# Patient Record
Sex: Female | Born: 1970 | Race: White | Hispanic: No | Marital: Married | Smoking: Current every day smoker
Health system: Southern US, Community
[De-identification: ages and names within clinical notes are randomized; demographics above are authoritative.]

## PROBLEM LIST (undated history)

## (undated) DIAGNOSIS — C569 Malignant neoplasm of unspecified ovary: Secondary | ICD-10-CM

## (undated) DIAGNOSIS — I10 Essential (primary) hypertension: Secondary | ICD-10-CM

## (undated) HISTORY — PX: ABDOMINAL HYSTERECTOMY: SHX81

---

## 2001-06-12 ENCOUNTER — Emergency Department (HOSPITAL_COMMUNITY): Admission: EM | Admit: 2001-06-12 | Discharge: 2001-06-13 | Payer: Self-pay | Admitting: Emergency Medicine

## 2005-01-24 ENCOUNTER — Ambulatory Visit: Payer: Self-pay | Admitting: Obstetrics and Gynecology

## 2010-02-27 ENCOUNTER — Emergency Department: Payer: Self-pay | Admitting: Emergency Medicine

## 2011-03-31 IMAGING — CT CT ABD-PELV W/ CM
1 of 2 series · 15 of 32 positions shown, 19 images · non-contrast
Comparison: none

REASON FOR EXAM: (1) generalized abdominal pain, fever, nausea diarrhea,
h/o total hysterectomy
COMMENTS:

[Series 2: appendicitis · axial · 0.72mm/px · z∈[-332,+100]mm · 15 of 158 slices shown, 19 images]
[im 7/158  soft-tissue]
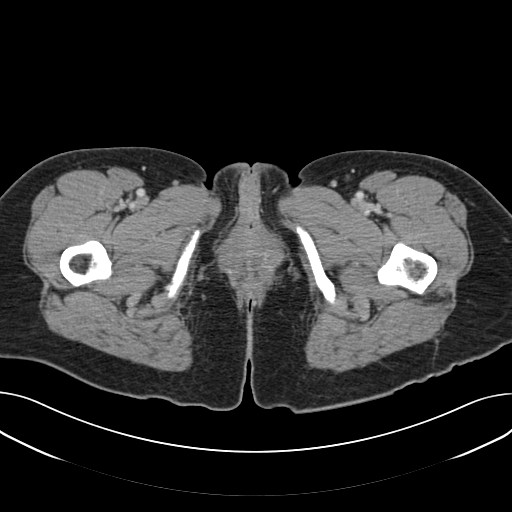
[im 7/158  bone]
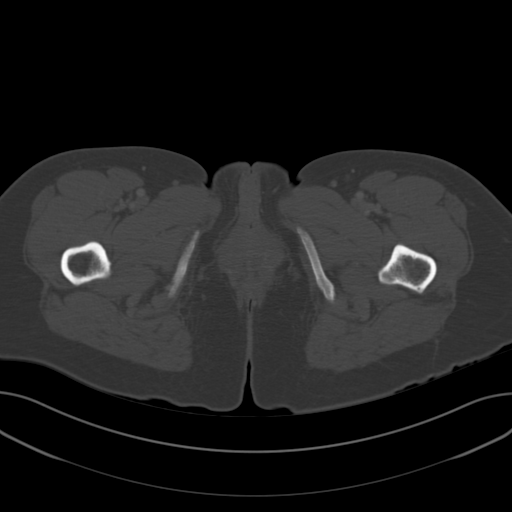
[im 20/158  soft-tissue]
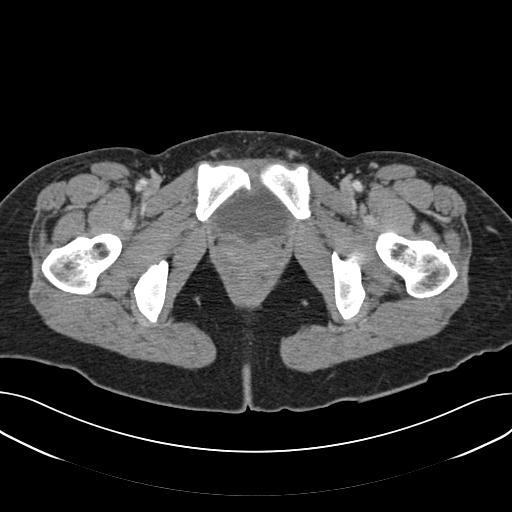
[im 33/158  soft-tissue]
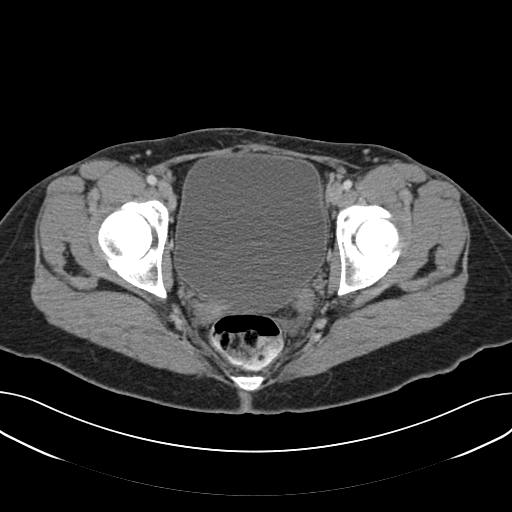
[im 46/158  soft-tissue]
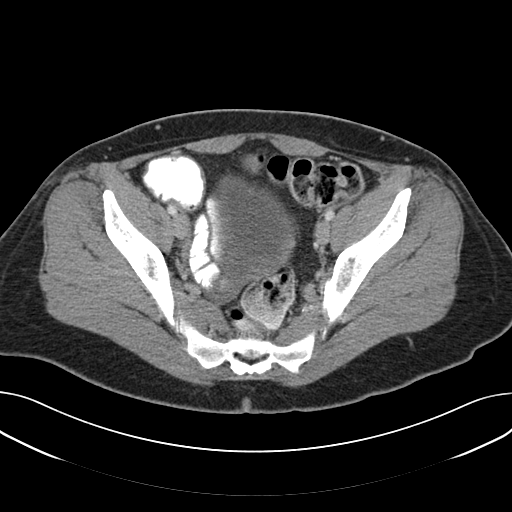
[im 53/158  soft-tissue]
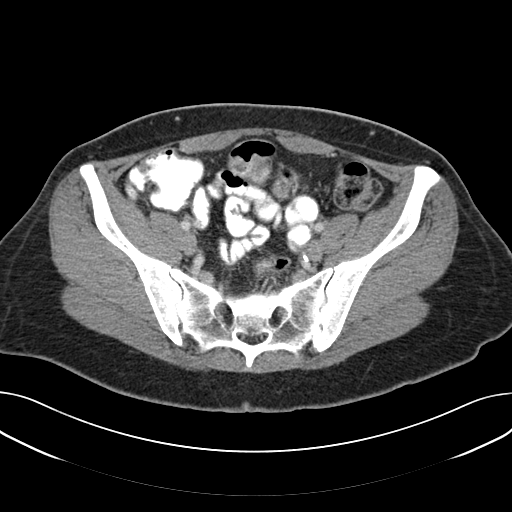
[im 66/158  soft-tissue]
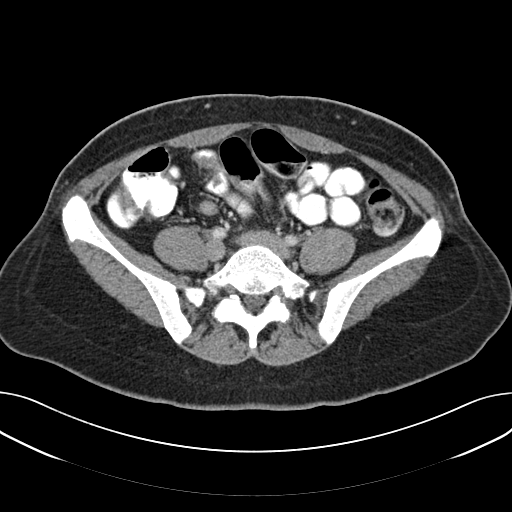
[im 79/158  soft-tissue]
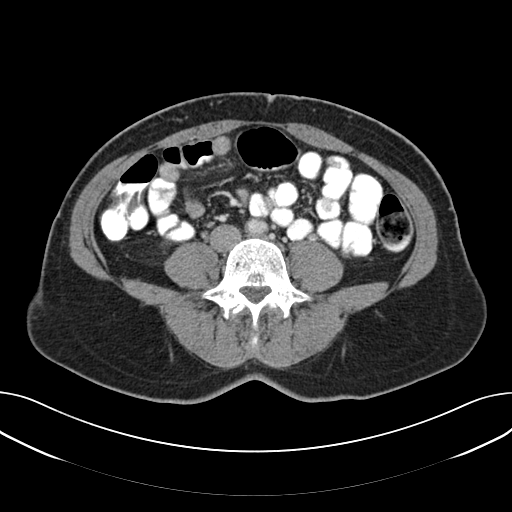
[im 92/158  soft-tissue]
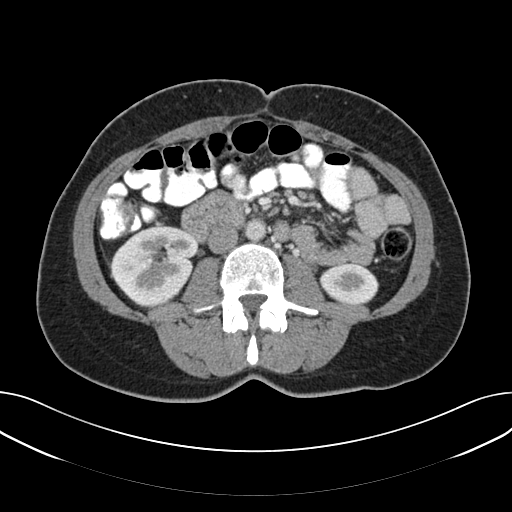
[im 105/158  soft-tissue]
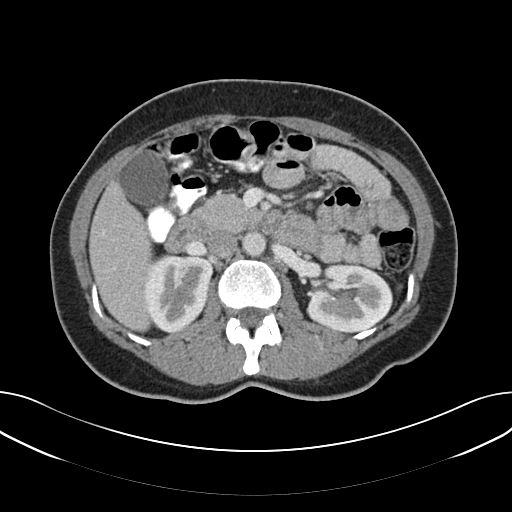
[im 105/158  bone]
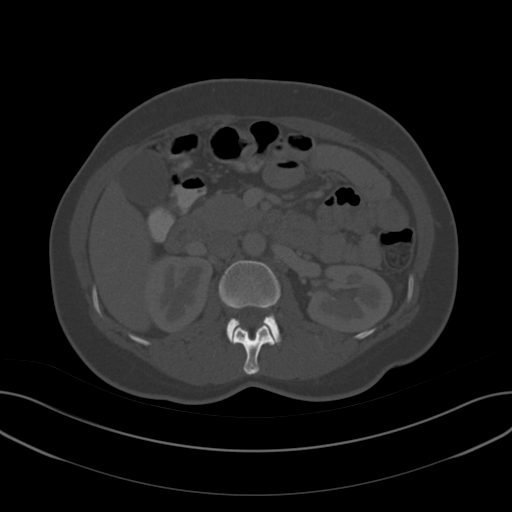
[im 112/158  soft-tissue]
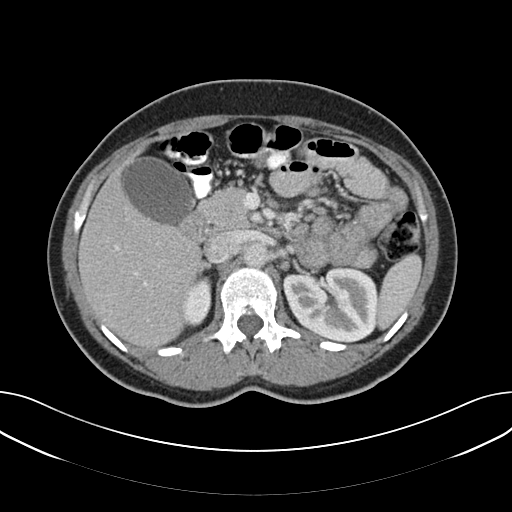
[im 125/158  soft-tissue]
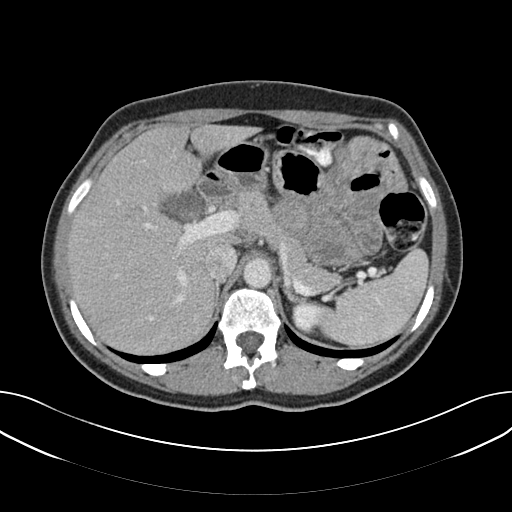
[im 131/158  lung]
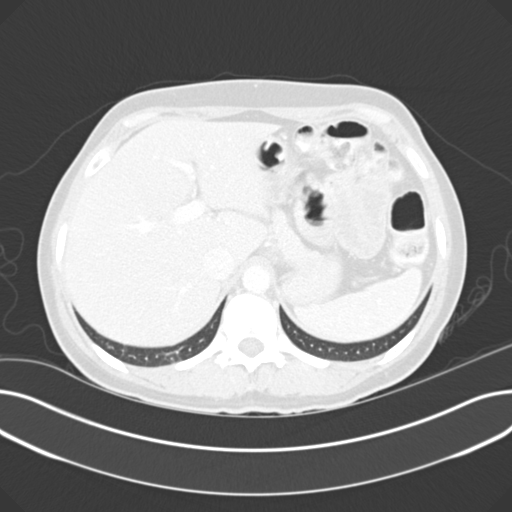
[im 138/158  soft-tissue]
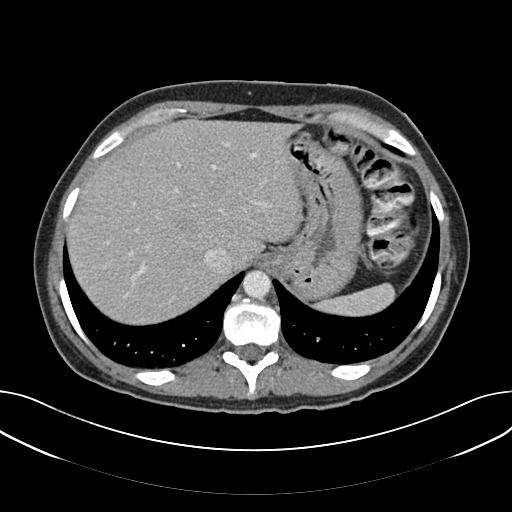
[im 138/158  lung]
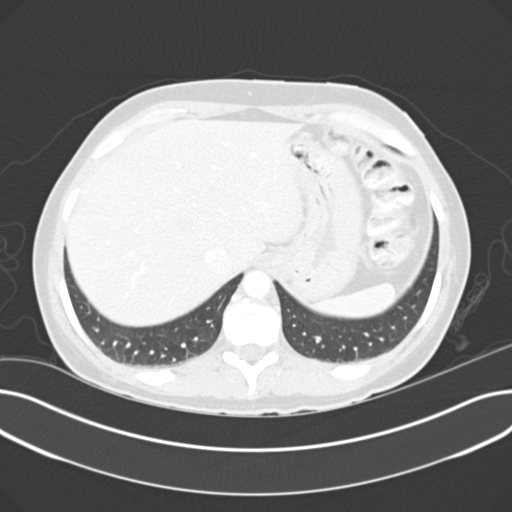
[im 144/158  lung]
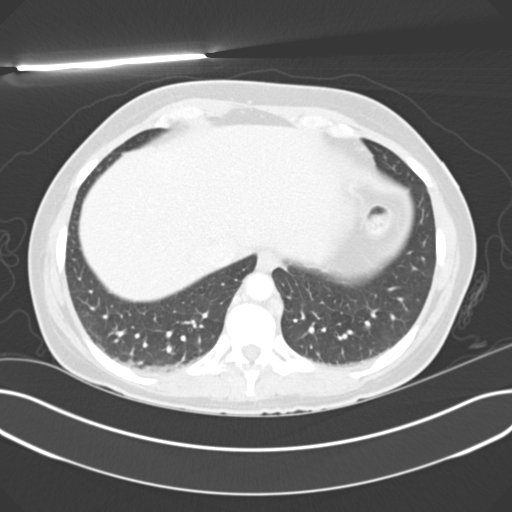
[im 151/158  soft-tissue]
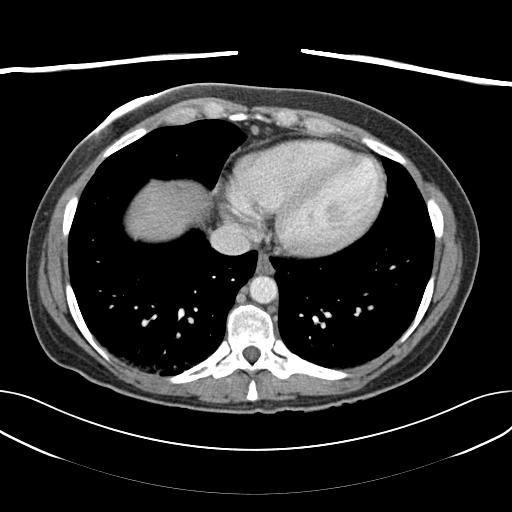
[im 151/158  lung]
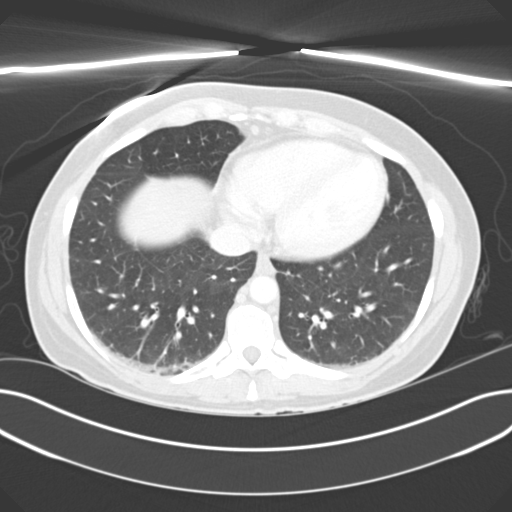

[15 of 32 positions shown; findings below may reference images not displayed]

PROCEDURE:     CT  - CT ABDOMEN / PELVIS  W  - February 27, 2010 [DATE]

RESULT:     Emergent CT of the abdomen and pelvis is performed utilizing 100
ml of Csovue-ZT4 iodinated intravenous contrast along with oral contrast.
Images are reconstructed at 3 mm slice thickness in the axial plane.

The lung bases demonstrate dependent atelectasis. The liver, spleen,
pancreas, kidneys, gallbladder and adrenal glands appear normal. The aorta
is normal in caliber. There is no evidence of free air, free fluid or
abnormal fluid collection. There is a moderate amount of urinary bladder
distention. Multiple phleboliths are present. The patient has a history of
hysterectomy. No adnexal mass is appreciated. No acute inflammatory
stranding is evident. There are scattered, small nonspecific mesenteric
lymph nodes present. The abdominal wall appears intact.
IMPRESSION: 1. Non-specific mesenteric lymph nodes without pathologic enlargement.
Otherwise, unremarkable exam aside from dependent atelectasis in the lung
bases.

## 2012-07-25 ENCOUNTER — Emergency Department: Payer: Self-pay | Admitting: Emergency Medicine

## 2012-07-25 LAB — BASIC METABOLIC PANEL
Chloride: 105 mmol/L (ref 98–107)
Co2: 29 mmol/L (ref 21–32)
Creatinine: 0.6 mg/dL (ref 0.60–1.30)
EGFR (African American): >60
EGFR (Non-African Amer.): >60
Potassium: 3.8 mmol/L (ref 3.5–5.1)
Sodium: 139 mmol/L (ref 136–145)

## 2012-07-25 LAB — CBC
MCH: 32.3 pg (ref 26.0–34.0)
MCV: 95 fL (ref 80–100)
Platelet: 256 10*3/uL (ref 150–440)
RBC: 4.57 10*6/uL (ref 3.80–5.20)
WBC: 9.1 10*3/uL (ref 3.6–11.0)

## 2012-07-25 LAB — CK TOTAL AND CKMB (NOT AT ARMC)
CK, Total: 51 U/L (ref 21–215)
CK-MB: <0.5 ng/mL — ABNORMAL LOW (ref 0.5–3.6)

## 2014-08-24 ENCOUNTER — Emergency Department: Payer: Self-pay | Admitting: Emergency Medicine

## 2014-08-24 LAB — BASIC METABOLIC PANEL
Anion Gap: 9 (ref 7–16)
BUN: 11 mg/dL (ref 7–18)
CALCIUM: 8.9 mg/dL (ref 8.5–10.1)
Chloride: 107 mmol/L (ref 98–107)
Co2: 23 mmol/L (ref 21–32)
Creatinine: 0.63 mg/dL (ref 0.60–1.30)
EGFR (Non-African Amer.): >60
Glucose: 97 mg/dL (ref 65–99)
Osmolality: 277 (ref 275–301)
Potassium: 3.7 mmol/L (ref 3.5–5.1)
Sodium: 139 mmol/L (ref 136–145)

## 2014-08-24 LAB — CBC
HCT: 40.4 % (ref 35.0–47.0)
HGB: 13.4 g/dL (ref 12.0–16.0)
MCH: 31.8 pg (ref 26.0–34.0)
MCHC: 33.1 g/dL (ref 32.0–36.0)
MCV: 96 fL (ref 80–100)
Platelet: 249 10*3/uL (ref 150–440)
RBC: 4.21 10*6/uL (ref 3.80–5.20)
RDW: 12.6 % (ref 11.5–14.5)
WBC: 10 10*3/uL (ref 3.6–11.0)

## 2014-08-24 LAB — PROTIME-INR
INR: 1.1
PROTHROMBIN TIME: 13.6 s (ref 11.5–14.7)

## 2014-08-24 LAB — TROPONIN I

## 2014-08-24 LAB — CK TOTAL AND CKMB (NOT AT ARMC)
CK, Total: 97 U/L
CK-MB: 0.7 ng/mL (ref 0.5–3.6)

## 2015-02-20 ENCOUNTER — Emergency Department: Payer: Self-pay

## 2016-03-02 ENCOUNTER — Emergency Department
Admission: EM | Admit: 2016-03-02 | Discharge: 2016-03-02 | Disposition: A | Discharge status: Home / Self Care | Payer: Self-pay | Attending: Emergency Medicine | Admitting: Emergency Medicine

## 2016-03-02 ENCOUNTER — Encounter: Payer: Self-pay | Admitting: Emergency Medicine

## 2016-03-02 ENCOUNTER — Emergency Department: Payer: Self-pay

## 2016-03-02 DIAGNOSIS — R079 Chest pain, unspecified: Secondary | ICD-10-CM

## 2016-03-02 DIAGNOSIS — R0789 Other chest pain: Secondary | ICD-10-CM | POA: Insufficient documentation

## 2016-03-02 DIAGNOSIS — I10 Essential (primary) hypertension: Secondary | ICD-10-CM | POA: Insufficient documentation

## 2016-03-02 DIAGNOSIS — F17210 Nicotine dependence, cigarettes, uncomplicated: Secondary | ICD-10-CM | POA: Insufficient documentation

## 2016-03-02 HISTORY — DX: Essential (primary) hypertension: I10

## 2016-03-02 LAB — CBC
HEMATOCRIT: 41.4 % (ref 35.0–47.0)
HEMOGLOBIN: 14.4 g/dL (ref 12.0–16.0)
MCH: 32.1 pg (ref 26.0–34.0)
MCHC: 34.8 g/dL (ref 32.0–36.0)
MCV: 92.2 fL (ref 80.0–100.0)
Platelets: 215 10*3/uL (ref 150–440)
RBC: 4.49 MIL/uL (ref 3.80–5.20)
RDW: 12.8 % (ref 11.5–14.5)
WBC: 7.4 10*3/uL (ref 3.6–11.0)

## 2016-03-02 LAB — BASIC METABOLIC PANEL
ANION GAP: 6 (ref 5–15)
BUN: 12 mg/dL (ref 6–20)
CO2: 24 mmol/L (ref 22–32)
Calcium: 9.5 mg/dL (ref 8.9–10.3)
Chloride: 105 mmol/L (ref 101–111)
Creatinine, Ser: 0.58 mg/dL (ref 0.44–1.00)
GLUCOSE: 102 mg/dL — AB (ref 65–99)
Potassium: 3.7 mmol/L (ref 3.5–5.1)
Sodium: 135 mmol/L (ref 135–145)

## 2016-03-02 LAB — TROPONIN I: Troponin I: <0.03 ng/mL (ref <0.031)

## 2016-03-02 NOTE — ED Provider Notes (Signed)
[facility] Emergency Department Provider Note  ____________________________________________  Time seen: Approximately 3:24 PM  I have reviewed the triage vital signs and the nursing notes.   HISTORY  Chief Complaint Chest Pain    HPI Rachel Trujillo is a 45 y.o. female complains of pain in the center of the chest coming and going for 3 days. Pain is achy. Pain seems to come on after she spent at work for several hours today came on as she after she had been at work for about 4 hours. Sometimes the patient's worse when she walks and gets better when she sits. Patient reports much less pain when she is at home pain does radiate to the left arm and she had some left arm numbness with the pain today. Pain is moderate in nature achy in quality when it comes it lasts perhaps 10 or 15 minutes. He will come and go all day long when it's coming. Patient has a history of her father dying of a massive heart attack middle-age. Patient does not have nausea or shortness of breath. Patient reports she has a very stressful job.  Past Medical History  Diagnosis Date  . Hypertension     There are no active problems to display for this patient.   Past Surgical History  Procedure Laterality Date  . Abdominal hysterectomy    . Cesarean section      No current outpatient prescriptions on file.  Allergies Aspirin  No family history on file.  Social History Social History  Substance Use Topics  . Smoking status: Current Every Day Smoker -- 0.50 packs/day    Types: Cigarettes  . Smokeless tobacco: Never Used  . Alcohol Use: No    Review of Systems Constitutional: No fever/chills Eyes: No visual changes. ENT: No sore throat. Cardiovascular: See history of present illness Respiratory: Denies shortness of breath. Gastrointestinal: No abdominal pain.  No nausea, no vomiting.  No diarrhea.  No constipation. Genitourinary: Negative for dysuria. Musculoskeletal:  Negative for back pain. Skin: Negative for rash. Neurological: Negative for headaches, focal weakness or numbness.  10-point ROS otherwise negative.  ____________________________________________   PHYSICAL EXAM:  VITAL SIGNS: ED Triage Vitals  Enc Vitals Group     BP 03/02/16 1142 131/77 mmHg     Pulse Rate 03/02/16 1142 83     Resp 03/02/16 1142 18     Temp 03/02/16 1142 98.1 F (36.7 C)     Temp Source 03/02/16 1142 Oral     SpO2 03/02/16 1142 97 %     Weight 03/02/16 1142 161 lb (73.029 kg)     Height 03/02/16 1142 5\' 7"  (1.702 m)     Head Cir --      Peak Flow --      Pain Score 03/02/16 1142 5     Pain Loc --      Pain Edu? --      Excl. in [place]? --     Constitutional: Alert and oriented. Well appearing and in no acute distress. Eyes: Conjunctivae are normal. PERRL. EOMI. Head: Atraumatic. Nose: No congestion/rhinnorhea. Mouth/Throat: Mucous membranes are moist.  Oropharynx non-erythematous. Neck: No stridor.   Cardiovascular: Normal rate, regular rhythm. Grossly normal heart sounds.  Good peripheral circulation. Respiratory: Normal respiratory effort.  No retractions. Lungs CTAB. Gastrointestinal: Soft and nontender. No distention. No abdominal bruits. No CVA tenderness. Musculoskeletal: No lower extremity tenderness nor edema.  No joint effusions. Neurologic:  Normal speech and language. No gross focal neurologic  deficits are appreciated. No gait instability. Skin:  Skin is warm, dry and intact. No rash noted. Psychiatric: Mood and affect are normal. Speech and behavior are normal.  ____________________________________________   LABS (all labs ordered are listed, but only abnormal results are displayed)  Labs Reviewed  BASIC METABOLIC PANEL - Abnormal; Notable for the following:    Glucose, Bld 102 (*)    All other components within normal limits  CBC  TROPONIN I  TROPONIN I   ____________________________________________  EKG  EKG read and  interpreted by me shows normal sinus rhythm a rate of 72 normal axis essentially normal EKG ____________________________________________  RADIOLOGY  Chest x-ray read as normal by radiology ____________________________________________   PROCEDURES    ____________________________________________   INITIAL IMPRESSION / ASSESSMENT AND PLAN / ED COURSE  Pertinent labs & imaging results that were available during my care of the patient were reviewed by me and considered in my medical decision making (see chart for details).   ____________________________________________   FINAL CLINICAL IMPRESSION(S) / ED DIAGNOSES  Final diagnoses:  Chest pain, unspecified chest pain type      Nena Polio, MD 03/02/16 1640

## 2016-03-02 NOTE — ED Notes (Signed)
C/o left sided chest pain, left arm pain, left arm numbness x 3 days.   Patient states she is feeling a lot of work stress lately.

## 2016-05-07 ENCOUNTER — Emergency Department
Admission: EM | Admit: 2016-05-07 | Discharge: 2016-05-07 | Disposition: A | Discharge status: Home / Self Care | Payer: BLUE CROSS/BLUE SHIELD | Attending: Emergency Medicine | Admitting: Emergency Medicine

## 2016-05-07 ENCOUNTER — Encounter: Payer: Self-pay | Admitting: Emergency Medicine

## 2016-05-07 DIAGNOSIS — F17210 Nicotine dependence, cigarettes, uncomplicated: Secondary | ICD-10-CM | POA: Insufficient documentation

## 2016-05-07 DIAGNOSIS — Z8543 Personal history of malignant neoplasm of ovary: Secondary | ICD-10-CM | POA: Insufficient documentation

## 2016-05-07 DIAGNOSIS — Z7982 Long term (current) use of aspirin: Secondary | ICD-10-CM | POA: Insufficient documentation

## 2016-05-07 DIAGNOSIS — M25521 Pain in right elbow: Secondary | ICD-10-CM

## 2016-05-07 DIAGNOSIS — I10 Essential (primary) hypertension: Secondary | ICD-10-CM | POA: Diagnosis not present

## 2016-05-07 DIAGNOSIS — M79621 Pain in right upper arm: Secondary | ICD-10-CM | POA: Insufficient documentation

## 2016-05-07 HISTORY — DX: Malignant neoplasm of unspecified ovary: C56.9

## 2016-05-07 MED ORDER — DEXAMETHASONE SODIUM PHOSPHATE 10 MG/ML IJ SOLN
10.0000 mg | Freq: Once | INTRAMUSCULAR | Status: AC
  Administered 2016-05-07: 10 mg via INTRAMUSCULAR
  Filled 2016-05-07: qty 1

## 2016-05-07 MED ORDER — TRAMADOL HCL 50 MG PO TABS
50.0000 mg | ORAL_TABLET | Freq: Once | ORAL | Status: AC
  Administered 2016-05-07: 50 mg via ORAL
  Filled 2016-05-07: qty 1

## 2016-05-07 MED ORDER — METHOCARBAMOL 750 MG PO TABS: 750.0000 mg | ORAL_TABLET | Freq: Four times a day (QID) | ORAL | Status: AC

## 2016-05-07 MED ORDER — METHOCARBAMOL 500 MG PO TABS
1000.0000 mg | ORAL_TABLET | Freq: Once | ORAL | Status: AC
  Administered 2016-05-07: 1000 mg via ORAL
  Filled 2016-05-07: qty 2

## 2016-05-07 MED ORDER — METHYLPREDNISOLONE 4 MG PO TBPK: ORAL_TABLET | ORAL | Status: AC

## 2016-05-07 MED ORDER — TRAMADOL HCL 50 MG PO TABS: 50.0000 mg | ORAL_TABLET | Freq: Four times a day (QID) | ORAL | Status: AC | PRN

## 2016-05-07 NOTE — ED Provider Notes (Signed)
[facility] Emergency Department Provider Note   ____________________________________________  Time seen: Approximately 11:22 PM  I have reviewed the triage vital signs and the nursing notes.   HISTORY  Chief Complaint Arm Pain    HPI Rachel Trujillo is a 45 y.o. female patient complain left arm pain that radiates up to her shoulder. Onset today. No provocative incident. Patient stated pain increases with extension of the left arm. Patient stated pain decreases by holding the arm and adduction. No other palliative measures for this complaint. Patient is a history of repetitive work. Patient is rating the pain as a 10 over 10. Patient described a pain as "sharp".   Past Medical History  Diagnosis Date  . Hypertension   . Ovarian cancer ([place])     There are no active problems to display for this patient.   Past Surgical History  Procedure Laterality Date  . Abdominal hysterectomy    . Cesarean section      Current Outpatient Rx  Name  Route  Sig  Dispense  Refill  . aspirin 81 MG tablet   Oral   Take 81 mg by mouth daily.         . methocarbamol (ROBAXIN-750) 750 MG tablet   Oral   Take 1 tablet (750 mg total) by mouth 4 (four) times daily.   20 tablet   0   . methylPREDNISolone (MEDROL DOSEPAK) 4 MG TBPK tablet      Take Tapered dose as directed   21 tablet   0   . traMADol (ULTRAM) 50 MG tablet   Oral   Take 1 tablet (50 mg total) by mouth every 6 (six) hours as needed for moderate pain.   12 tablet   0     Allergies Aspirin  History reviewed. No pertinent family history.  Social History Social History  Substance Use Topics  . Smoking status: Current Every Day Smoker -- 0.50 packs/day    Types: Cigarettes  . Smokeless tobacco: Never Used  . Alcohol Use: No    Review of Systems Constitutional: No fever/chills Eyes: No visual changes. ENT: No sore throat. Cardiovascular: Denies chest pain. Respiratory: Denies  shortness of breath. Gastrointestinal: No abdominal pain.  No nausea, no vomiting.  No diarrhea.  No constipation. Genitourinary: Negative for dysuria. Musculoskeletal: Left upper extremity pain  Skin: Negative for rash. Neurological: Negative for headaches, focal weakness or numbness. Endocrine:Hypertension : Allergic/Immunilogical: Aspirin ____________________________________________   PHYSICAL EXAM:  VITAL SIGNS: ED Triage Vitals  Enc Vitals Group     BP 05/07/16 2309 127/70 mmHg     Pulse Rate 05/07/16 2309 77     Resp 05/07/16 2309 18     Temp 05/07/16 2309 97.6 F (36.4 C)     Temp src --      SpO2 05/07/16 2309 99 %     Weight 05/07/16 2309 160 lb (72.576 kg)     Height 05/07/16 2309 5\' 7"  (1.702 m)     Head Cir --      Peak Flow --      Pain Score 05/07/16 2311 10     Pain Loc --      Pain Edu? --      Excl. in [place]? --     Constitutional: Alert and oriented. Well appearing and in no acute distress. Eyes: Conjunctivae are normal. PERRL. EOMI. Head: Atraumatic. Nose: No congestion/rhinnorhea. Mouth/Throat: Mucous membranes are moist.  Oropharynx non-erythematous. Neck: No stridor.  No cervical  spine tenderness to palpation. Hematological/Lymphatic/Immunilogical: No cervical lymphadenopathy. Cardiovascular: Normal rate, regular rhythm. Grossly normal heart sounds.  Good peripheral circulation. Respiratory: Normal respiratory effort.  No retractions. Lungs CTAB. Gastrointestinal: Soft and nontender. No distention. No abdominal bruits. No CVA tenderness. Musculoskeletal: No obvious deformity of the left upper extremity. No edema or erythema. Patient has some moderate guarding palpation of the lateral bicep and posterior humerus. Patient demonstrates decreased range of motion with extension limited by complaining of pain. Patient decision arm across her chest as position of comfort. Neurologic:  Normal speech and language. No gross focal neurologic deficits are  appreciated. No gait instability. Skin:  Skin is warm, dry and intact. No rash noted. Psychiatric: Mood and affect are normal. Speech and behavior are normal.  ____________________________________________   LABS (all labs ordered are listed, but only abnormal results are displayed)  Labs Reviewed - No data to display ____________________________________________  EKG   ____________________________________________  RADIOLOGY   ____________________________________________   PROCEDURES  Procedure(s) performed: None  Critical Care performed: No  ____________________________________________   INITIAL IMPRESSION / ASSESSMENT AND PLAN / ED COURSE  Pertinent labs & imaging results that were available during my care of the patient were reviewed by me and considered in my medical decision making (see chart for details).  Arthralgia left upper extremity. Patient given discharge care instructions. Patient given prescription for tramadol, Robaxin, and [place] dosepak. Patient placed in a sling and advised to wear for 2-3 days as needed. Patient advised follow-up with [facility] for continual care.  FINAL CLINICAL IMPRESSION(S) / ED DIAGNOSES  Final diagnoses:  Arthralgia of upper arm, right      NEW MEDICATIONS STARTED DURING THIS VISIT:  New Prescriptions   METHOCARBAMOL (ROBAXIN-750) 750 MG TABLET    Take 1 tablet (750 mg total) by mouth 4 (four) times daily.   METHYLPREDNISOLONE (MEDROL DOSEPAK) 4 MG TBPK TABLET    Take Tapered dose as directed   TRAMADOL (ULTRAM) 50 MG TABLET    Take 1 tablet (50 mg total) by mouth every 6 (six) hours as needed for moderate pain.     Note:  This document was prepared using Dragon voice recognition software and may include unintentional dictation errors.    Sable Feil, PA-C 05/07/16 2331  Earleen Newport, MD 05/07/16 [phone]

## 2016-05-07 NOTE — ED Notes (Signed)
Patient reports pain that started at left elbow and radiates up to left shoulder.  Patient denies any type of injury.  Good left radial pulse, cap refill within normal limits.  Pain increases with movement.

## 2016-05-07 NOTE — ED Notes (Signed)
Pt c/o pain to her left upper arm that radiates up into the left side of her neck; fingers tingling; pain increases with movement; pt says she does repetitive work at her job

## 2016-05-07 NOTE — Discharge Instructions (Signed)
Musculoskeletal Pain Musculoskeletal pain is muscle and boney aches and pains. These pains can occur in any part of the body. Your caregiver may treat you without knowing the cause of the pain. They may treat you if blood or urine tests, X-rays, and other tests were normal.  CAUSES There is often not a definite cause or reason for these pains. These pains may be caused by a type of germ (virus). The discomfort may also come from overuse. Overuse includes working out too hard when your body is not fit. Boney aches also come from weather changes. Bone is sensitive to atmospheric pressure changes. HOME CARE INSTRUCTIONS: Wear arm sling for 2-3 days as needed.  Ask when your test results will be ready. Make sure you get your test results.  Only take over-the-counter or prescription medicines for pain, discomfort, or fever as directed by your caregiver. If you were given medications for your condition, do not drive, operate machinery or power tools, or sign legal documents for 24 hours. Do not drink alcohol. Do not take sleeping pills or other medications that may interfere with treatment.  Continue all activities unless the activities cause more pain. When the pain lessens, slowly resume normal activities. Gradually increase the intensity and duration of the activities or exercise.  During periods of severe pain, bed rest may be helpful. Lay or sit in any position that is comfortable.  Putting ice on the injured area.  Put ice in a bag.  Place a towel between your skin and the bag.  Leave the ice on for 15 to 20 minutes, 3 to 4 times a day.  Follow up with your caregiver for continued problems and no reason can be found for the pain. If the pain becomes worse or does not go away, it may be necessary to repeat tests or do additional testing. Your caregiver may need to look further for a possible cause. SEEK IMMEDIATE MEDICAL CARE IF:  You have pain that is getting worse and is not relieved by  medications.  You develop chest pain that is associated with shortness or breath, sweating, feeling sick to your stomach (nauseous), or throw up (vomit).  Your pain becomes localized to the abdomen.  You develop any new symptoms that seem different or that concern you. MAKE SURE YOU:   Understand these instructions.  Will watch your condition.  Will get help right away if you are not doing well or get worse.   This information is not intended to replace advice given to you by your health care provider. Make sure you discuss any questions you have with your health care provider.   Document Released: 11/20/2005 Document Revised: 02/12/2012 Document Reviewed: 07/25/2013 Elsevier Interactive Patient Education 2016 [place] therapy can help ease sore, stiff, injured, and tight muscles and joints. Heat relaxes your muscles, which may help ease your pain. Heat therapy should only be used on old, pre-existing, or long-lasting (chronic) injuries. Do not use heat therapy unless told by your doctor. HOW TO USE HEAT THERAPY There are several different kinds of heat therapy, including:  Moist heat pack.  Warm water bath.  Hot water bottle.  Electric heating pad.  Heated gel pack.  Heated wrap.  Electric heating pad. GENERAL HEAT THERAPY RECOMMENDATIONS   Do not sleep while using heat therapy. Only use heat therapy while you are awake.  Your skin may turn pink while using heat therapy. Do not use heat therapy if your skin turns red.  Do not use heat therapy if you have new pain.  High heat or long exposure to heat can cause burns. Be careful when using heat therapy to avoid burning your skin.  Do not use heat therapy on areas of your skin that are already irritated, such as with a rash or sunburn. GET HELP IF:   You have blisters, redness, swelling (puffiness), or numbness.  You have new pain.  Your pain is worse. MAKE SURE YOU:  Understand these  instructions.  Will watch your condition.  Will get help right away if you are not doing well or get worse.   This information is not intended to replace advice given to you by your health care provider. Make sure you discuss any questions you have with your health care provider.   Document Released: 02/12/2012 Document Revised: 12/11/2014 Document Reviewed: 01/13/2014 Elsevier Interactive Patient Education [employer].

## 2017-04-03 IMAGING — CR DG CHEST 2V
2 series · 2 of 2 positions shown · non-contrast
Comparison: 08/24/2014

CLINICAL DATA: Left arm pain for 3 days.

EXAM:
CHEST  2 VIEW

[chest pa]
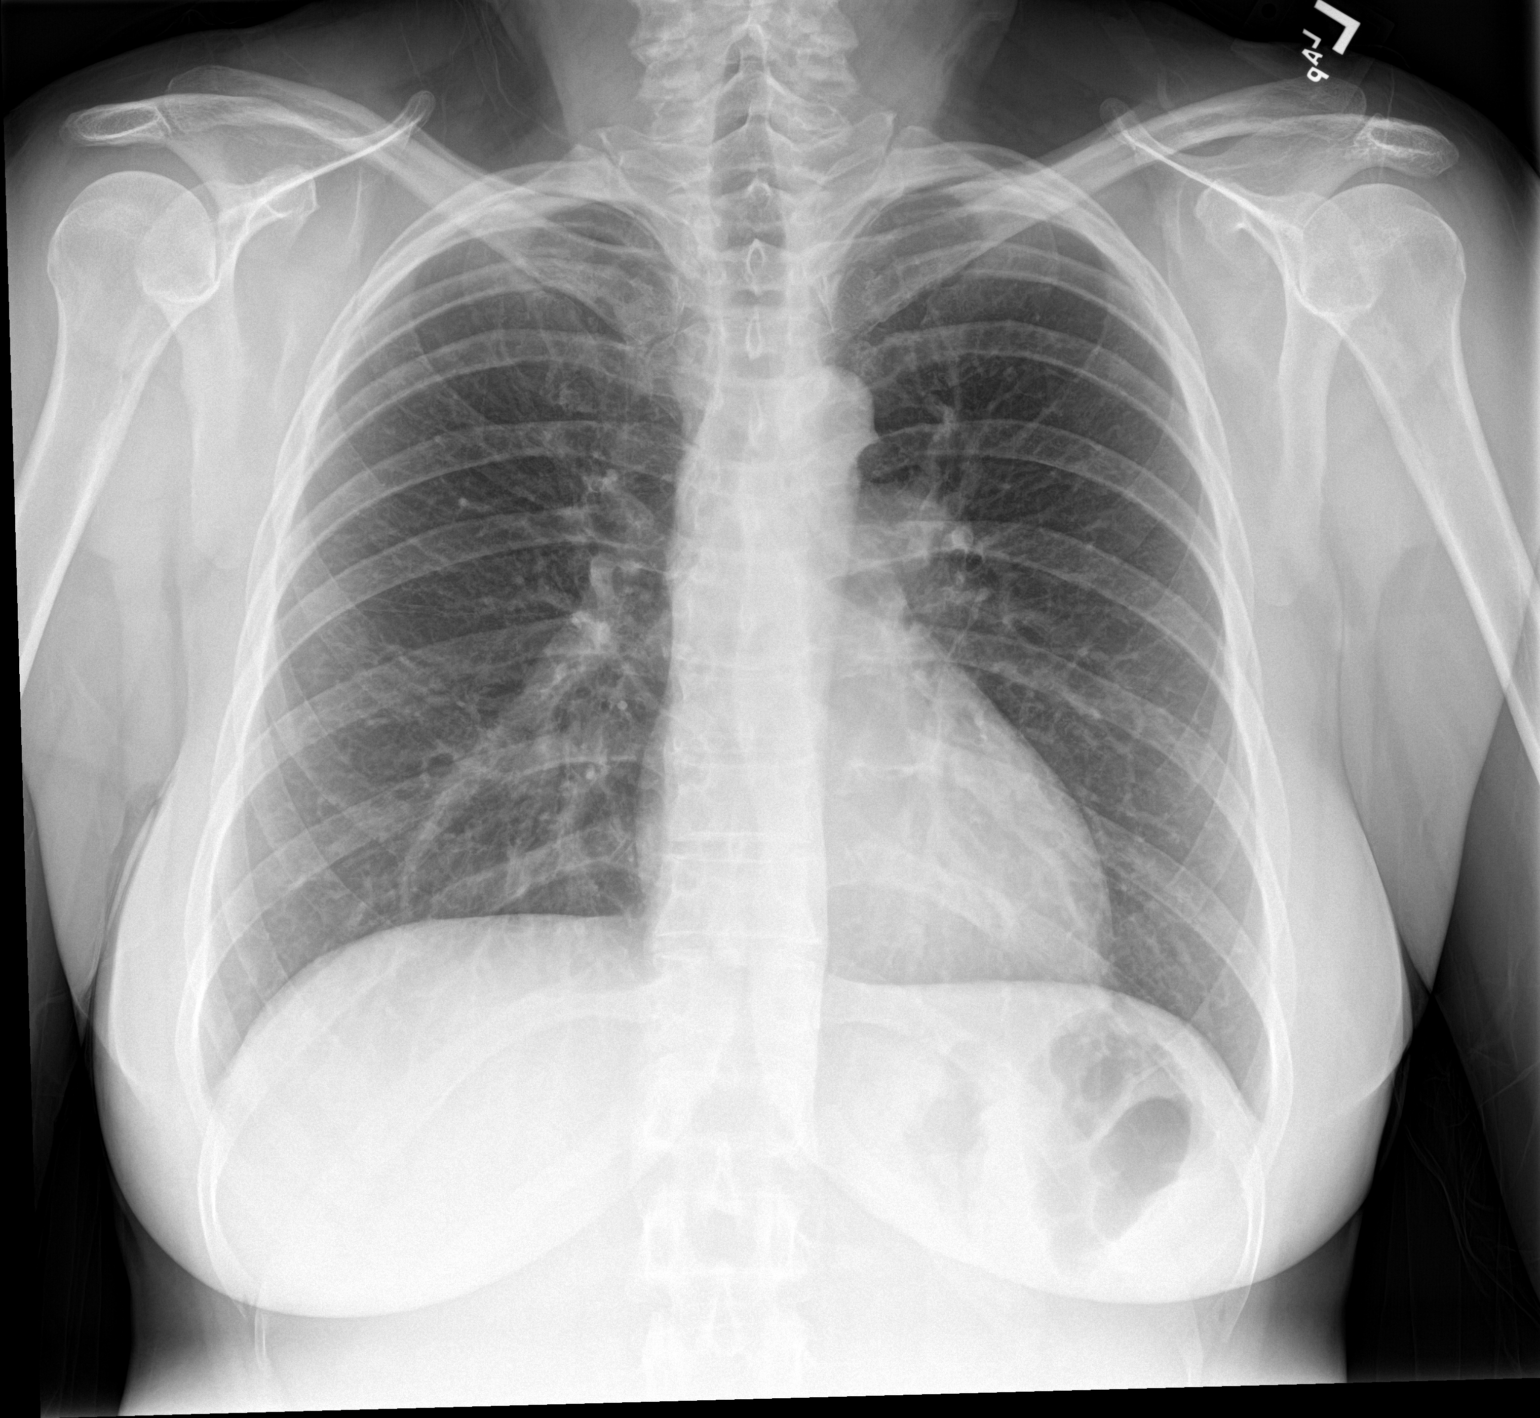

[chest lat]
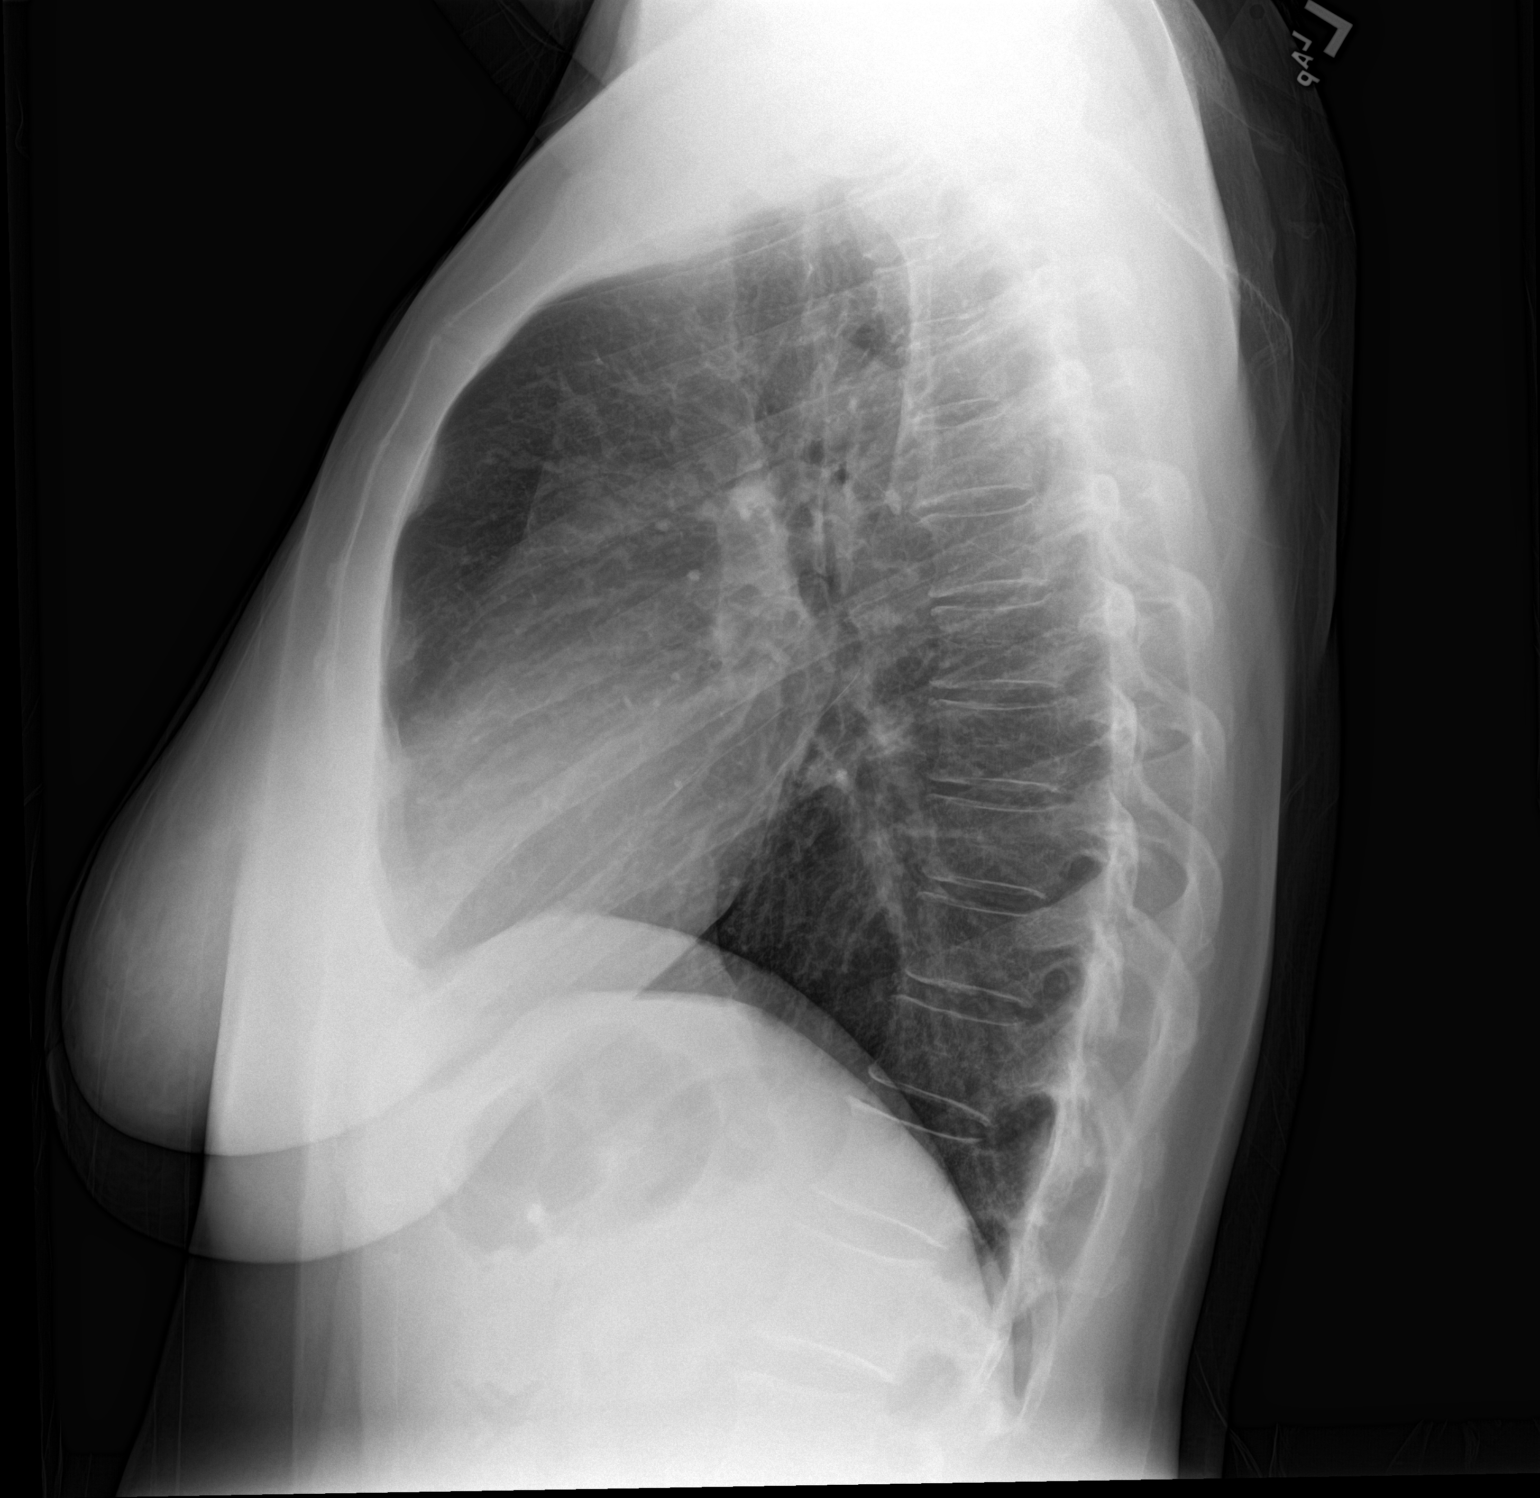

[2 of 2 positions shown; findings below may reference images not displayed]

FINDINGS: The cardiac silhouette, mediastinal and hilar contours are within
normal limits stable. The lungs are clear. No pleural effusion the
bony thorax intact
IMPRESSION: Normal chest x-ray.

## 2018-04-07 ENCOUNTER — Other Ambulatory Visit: Payer: Self-pay

## 2018-04-07 ENCOUNTER — Emergency Department: Payer: BLUE CROSS/BLUE SHIELD

## 2018-04-07 ENCOUNTER — Encounter: Payer: Self-pay | Admitting: Emergency Medicine

## 2018-04-07 DIAGNOSIS — Y929 Unspecified place or not applicable: Secondary | ICD-10-CM | POA: Insufficient documentation

## 2018-04-07 DIAGNOSIS — M62838 Other muscle spasm: Secondary | ICD-10-CM | POA: Diagnosis not present

## 2018-04-07 DIAGNOSIS — Z8543 Personal history of malignant neoplasm of ovary: Secondary | ICD-10-CM | POA: Diagnosis not present

## 2018-04-07 DIAGNOSIS — Y998 Other external cause status: Secondary | ICD-10-CM | POA: Diagnosis not present

## 2018-04-07 DIAGNOSIS — Z7982 Long term (current) use of aspirin: Secondary | ICD-10-CM | POA: Insufficient documentation

## 2018-04-07 DIAGNOSIS — Y9389 Activity, other specified: Secondary | ICD-10-CM | POA: Insufficient documentation

## 2018-04-07 DIAGNOSIS — S46911A Strain of unspecified muscle, fascia and tendon at shoulder and upper arm level, right arm, initial encounter: Secondary | ICD-10-CM | POA: Diagnosis not present

## 2018-04-07 DIAGNOSIS — R0789 Other chest pain: Secondary | ICD-10-CM | POA: Diagnosis not present

## 2018-04-07 DIAGNOSIS — F17210 Nicotine dependence, cigarettes, uncomplicated: Secondary | ICD-10-CM | POA: Diagnosis not present

## 2018-04-07 DIAGNOSIS — I10 Essential (primary) hypertension: Secondary | ICD-10-CM | POA: Insufficient documentation

## 2018-04-07 DIAGNOSIS — Z79899 Other long term (current) drug therapy: Secondary | ICD-10-CM | POA: Diagnosis not present

## 2018-04-07 DIAGNOSIS — S4991XA Unspecified injury of right shoulder and upper arm, initial encounter: Secondary | ICD-10-CM | POA: Diagnosis present

## 2018-04-07 DIAGNOSIS — X500XXA Overexertion from strenuous movement or load, initial encounter: Secondary | ICD-10-CM | POA: Insufficient documentation

## 2018-04-07 DIAGNOSIS — S46912A Strain of unspecified muscle, fascia and tendon at shoulder and upper arm level, left arm, initial encounter: Secondary | ICD-10-CM | POA: Diagnosis not present

## 2018-04-07 LAB — CBC
HCT: 41.7 % (ref 35.0–47.0)
Hemoglobin: 14.4 g/dL (ref 12.0–16.0)
MCH: 32.1 pg (ref 26.0–34.0)
MCHC: 34.5 g/dL (ref 32.0–36.0)
MCV: 93.2 fL (ref 80.0–100.0)
PLATELETS: 260 10*3/uL (ref 150–440)
RBC: 4.48 MIL/uL (ref 3.80–5.20)
RDW: 13 % (ref 11.5–14.5)
WBC: 11.7 10*3/uL — ABNORMAL HIGH (ref 3.6–11.0)

## 2018-04-07 LAB — BASIC METABOLIC PANEL
Anion gap: 6 (ref 5–15)
BUN: 11 mg/dL (ref 6–20)
CALCIUM: 9.6 mg/dL (ref 8.9–10.3)
CO2: 26 mmol/L (ref 22–32)
CREATININE: 0.53 mg/dL (ref 0.44–1.00)
Chloride: 105 mmol/L (ref 101–111)
GFR calc non Af Amer: >60 mL/min (ref >60)
Glucose, Bld: 115 mg/dL — ABNORMAL HIGH (ref 65–99)
Potassium: 3.7 mmol/L (ref 3.5–5.1)
SODIUM: 137 mmol/L (ref 135–145)

## 2018-04-07 LAB — TROPONIN I

## 2018-04-07 NOTE — ED Triage Notes (Addendum)
Pt says she had both arms up, working on something above her had a work on Friday; recalls a pulling/straining sensation in her left shoulder; pain now across both shoulders and upper back; worse with movement; pt says she's been having trouble finding a comfortable position to sleep in at night; pain improves with Ibuprofen; pt says she has also been getting pressure across the whole top area of her chest; some shortness of breath;

## 2018-04-08 ENCOUNTER — Emergency Department
Admission: EM | Admit: 2018-04-08 | Discharge: 2018-04-08 | Disposition: A | Discharge status: Home / Self Care | Payer: BLUE CROSS/BLUE SHIELD | Attending: Emergency Medicine | Admitting: Emergency Medicine

## 2018-04-08 DIAGNOSIS — M62838 Other muscle spasm: Secondary | ICD-10-CM

## 2018-04-08 DIAGNOSIS — R0789 Other chest pain: Secondary | ICD-10-CM

## 2018-04-08 DIAGNOSIS — T148XXA Other injury of unspecified body region, initial encounter: Secondary | ICD-10-CM

## 2018-04-08 MED ORDER — IBUPROFEN 800 MG PO TABS: 800.0000 mg | ORAL_TABLET | Freq: Three times a day (TID) | ORAL | 0 refills | Status: AC | PRN

## 2018-04-08 MED ORDER — HYDROCODONE-ACETAMINOPHEN 5-325 MG PO TABS
1.0000 | ORAL_TABLET | Freq: Once | ORAL | Status: AC
  Administered 2018-04-08: 1 via ORAL
  Filled 2018-04-08: qty 1

## 2018-04-08 MED ORDER — CYCLOBENZAPRINE HCL 10 MG PO TABS
5.0000 mg | ORAL_TABLET | Freq: Once | ORAL | Status: AC
  Administered 2018-04-08: 5 mg via ORAL
  Filled 2018-04-08: qty 1

## 2018-04-08 MED ORDER — IBUPROFEN 800 MG PO TABS
800.0000 mg | ORAL_TABLET | Freq: Once | ORAL | Status: AC
  Administered 2018-04-08: 800 mg via ORAL
  Filled 2018-04-08: qty 1

## 2018-04-08 MED ORDER — HYDROCODONE-ACETAMINOPHEN 5-325 MG PO TABS: 1.0000 | ORAL_TABLET | Freq: Four times a day (QID) | ORAL | 0 refills | Status: AC | PRN

## 2018-04-08 MED ORDER — CYCLOBENZAPRINE HCL 5 MG PO TABS: ORAL_TABLET | ORAL | 0 refills | Status: AC

## 2018-04-08 NOTE — ED Provider Notes (Signed)
[facility] Emergency Department Provider Note   ____________________________________________   First MD Initiated Contact with Patient 04/08/18 0025     (approximate)  I have reviewed the triage vital signs and the nursing notes.   HISTORY  Chief Complaint Shoulder Pain and Chest Pain    HPI Rachel Trujillo is a 47 y.o. female who presents to the ED from home with a chief complaint of pain across her back, shoulders and chest.  2 days ago she was working on something above her head with both arms up.  Recalls a pulling/straining sensation in her left shoulder.  Over the course of the weekend the pain is now across both shoulders and upper back, worse with movement.  Since yesterday she has been experiencing pain across the top of her chest.  Partial relief with ibuprofen.  Denies associated diaphoresis, shortness of breath, nausea/vomiting, palpitations, dizziness.  Denies recent fever, chills, abdominal pain, dysuria, diarrhea.  Denies recent travel or trauma.  Denies hormone use.   Past Medical History:  Diagnosis Date  . Hypertension   . Ovarian cancer ([place])     There are no active problems to display for this patient.   Past Surgical History:  Procedure Laterality Date  . ABDOMINAL HYSTERECTOMY    . CESAREAN SECTION      Prior to Admission medications   Medication Sig Start Date End Date Taking? Authorizing Provider  aspirin 81 MG tablet Take 81 mg by mouth daily.    [provider]  methocarbamol (ROBAXIN-750) 750 MG tablet Take 1 tablet (750 mg total) by mouth 4 (four) times daily. 05/07/16   Sable Feil, PA-C  methylPREDNISolone (MEDROL DOSEPAK) 4 MG TBPK tablet Take Tapered dose as directed 05/07/16   Sable Feil, PA-C  traMADol (ULTRAM) 50 MG tablet Take 1 tablet (50 mg total) by mouth every 6 (six) hours as needed for moderate pain. 05/07/16   Sable Feil, PA-C    Allergies Aspirin  History reviewed. No pertinent  family history.  Social History Social History   Tobacco Use  . Smoking status: Current Every Day Smoker    Packs/day: 0.50    Types: Cigarettes  . Smokeless tobacco: Never Used  Substance Use Topics  . Alcohol use: No  . Drug use: No    Review of Systems  Constitutional: No fever/chills. Eyes: No visual changes. ENT: No sore throat. Cardiovascular: Positive for chest pain. Respiratory: Denies shortness of breath. Gastrointestinal: No abdominal pain.  No nausea, no vomiting.  No diarrhea.  No constipation. Genitourinary: Negative for dysuria. Musculoskeletal: Positive for back and shoulder pain. Skin: Negative for rash. Neurological: Negative for headaches, focal weakness or numbness.   ____________________________________________   PHYSICAL EXAM:  VITAL SIGNS: ED Triage Vitals  Enc Vitals Group     BP 04/07/18 2125 (!) 144/81     Pulse Rate 04/07/18 2125 74     Resp 04/07/18 2125 17     Temp 04/07/18 2125 98.1 F (36.7 C)     Temp Source 04/07/18 2125 Oral     SpO2 04/07/18 2125 98 %     Weight 04/07/18 2126 184 lb (83.5 kg)     Height 04/07/18 2126 5\' 7"  (1.702 m)     Head Circumference --      Peak Flow --      Pain Score 04/07/18 2126 7     Pain Loc --      Pain Edu? --  Excl. in [place]? --     Constitutional: Alert and oriented. Well appearing and in no acute distress. Eyes: Conjunctivae are normal. PERRL. EOMI. Head: Atraumatic. Nose: No congestion/rhinnorhea. Mouth/Throat: Mucous membranes are moist.  Oropharynx non-erythematous. Neck: No stridor.  No carotid bruits.  No cervical spine tenderness to palpation.  Bilateral trapezius muscle spasms. Cardiovascular: Normal rate, regular rhythm. Grossly normal heart sounds.  Good peripheral circulation. Respiratory: Normal respiratory effort.  No retractions. Lungs CTAB.  Anterior chest wall tender to palpation and with movement of trunk and arms. Gastrointestinal: Soft and nontender. No distention. No  abdominal bruits. No CVA tenderness. Musculoskeletal: Bilateral shoulders and upper back tender upon raising arms.  No lower extremity tenderness nor edema.  No joint effusions. Neurologic:  Normal speech and language. No gross focal neurologic deficits are appreciated. No gait instability. Skin:  Skin is warm, dry and intact. No rash noted. Psychiatric: Mood and affect are normal. Speech and behavior are normal.  ____________________________________________   LABS (all labs ordered are listed, but only abnormal results are displayed)  Labs Reviewed  BASIC METABOLIC PANEL - Abnormal; Notable for the following components:      Result Value   Glucose, Bld 115 (*)    All other components within normal limits  CBC - Abnormal; Notable for the following components:   WBC 11.7 (*)    All other components within normal limits  TROPONIN I   ____________________________________________  EKG  ED ECG REPORT I, Meera Vasco J, the attending physician, personally viewed and interpreted this ECG.   Date: 04/08/2018  EKG Time: 2150  Rate: 71  Rhythm: normal EKG, normal sinus rhythm  Axis: Normal  Intervals:none  ST&T Change: Nonspecific  ____________________________________________  RADIOLOGY  ED MD interpretation: No acute cardiopulmonary process  Official radiology report(s): Dg Chest 2 View  Result Date: 04/07/2018 CLINICAL DATA:  Pulling or straining sensation in the left shoulder. Pain across both shoulders and upper back. EXAM: CHEST - 2 VIEW COMPARISON:  03/02/2016 FINDINGS: The heart size and mediastinal contours are within normal limits. No mediastinal widening. No aortic aneurysm. Both lungs are clear. The visualized skeletal structures are unremarkable. IMPRESSION: Stable appearance of the chest.  No active cardiopulmonary disease. Electronically Signed   By: Ashley Royalty M.D.   On: 04/07/2018 22:05    ____________________________________________   PROCEDURES  Procedure(s)  performed: None  Procedures  Critical Care performed: No  ____________________________________________   INITIAL IMPRESSION / ASSESSMENT AND PLAN / ED COURSE  As part of my medical decision making, I reviewed the following data within the electronic MEDICAL RECORD NUMBER History obtained from family, Nursing notes reviewed and incorporated, Labs reviewed, EKG interpreted, Old chart reviewed, Radiograph reviewed and Notes from prior ED visits   47 year old female with hypertension who presents with a 3-day history of upper back, shoulders and upper chest pain with spasms after lifting arms/straining injury.  Differential diagnosis includes but is not limited to musculoskeletal, CAD, PE, dissection, etc.   Laboratory, EKG, chest x-ray results negative.  Do not think repeat troponin is warranted as patient has been experiencing symptoms for 3 days.  Most likely musculoskeletal strain.  Will place on NSAIDs, Norco for analgesia, Flexeril for muscle spasms.  Also encouraged moist heat to affected area.  Patient will follow-up with her PCP next week.  Strict return precautions given.  Patient and spouse verbalize understanding and agree with plan of care.      ____________________________________________   FINAL CLINICAL IMPRESSION(S) / ED DIAGNOSES  Final  diagnoses:  Chest wall pain  Muscle strain  Muscle spasms of neck     ED Discharge Orders    None       Note:  This document was prepared using Dragon voice recognition software and may include unintentional dictation errors.    Paulette Blanch, MD 04/08/18 [phone]

## 2018-04-08 NOTE — Discharge Instructions (Addendum)
1.  You may take medicines as needed for pain and muscle spasms (Motrin/Norco/Flexeril #15). 2.  Apply moist heat to affected area several times daily. 3.  Return to the ER for worsening symptoms, persistent vomiting, difficulty breathing or other concerns.

## 2019-05-09 IMAGING — CR DG CHEST 2V
1 series · 2 of 2 positions shown · non-contrast
Comparison: 03/02/2016

CLINICAL DATA: Pulling or straining sensation in the left shoulder.
Pain across both shoulders and upper back.

EXAM:
CHEST - 2 VIEW

[Series 1: dg chest 2 view · 0.14mm/px · 2 of 2 slices shown]
[im 1/2]
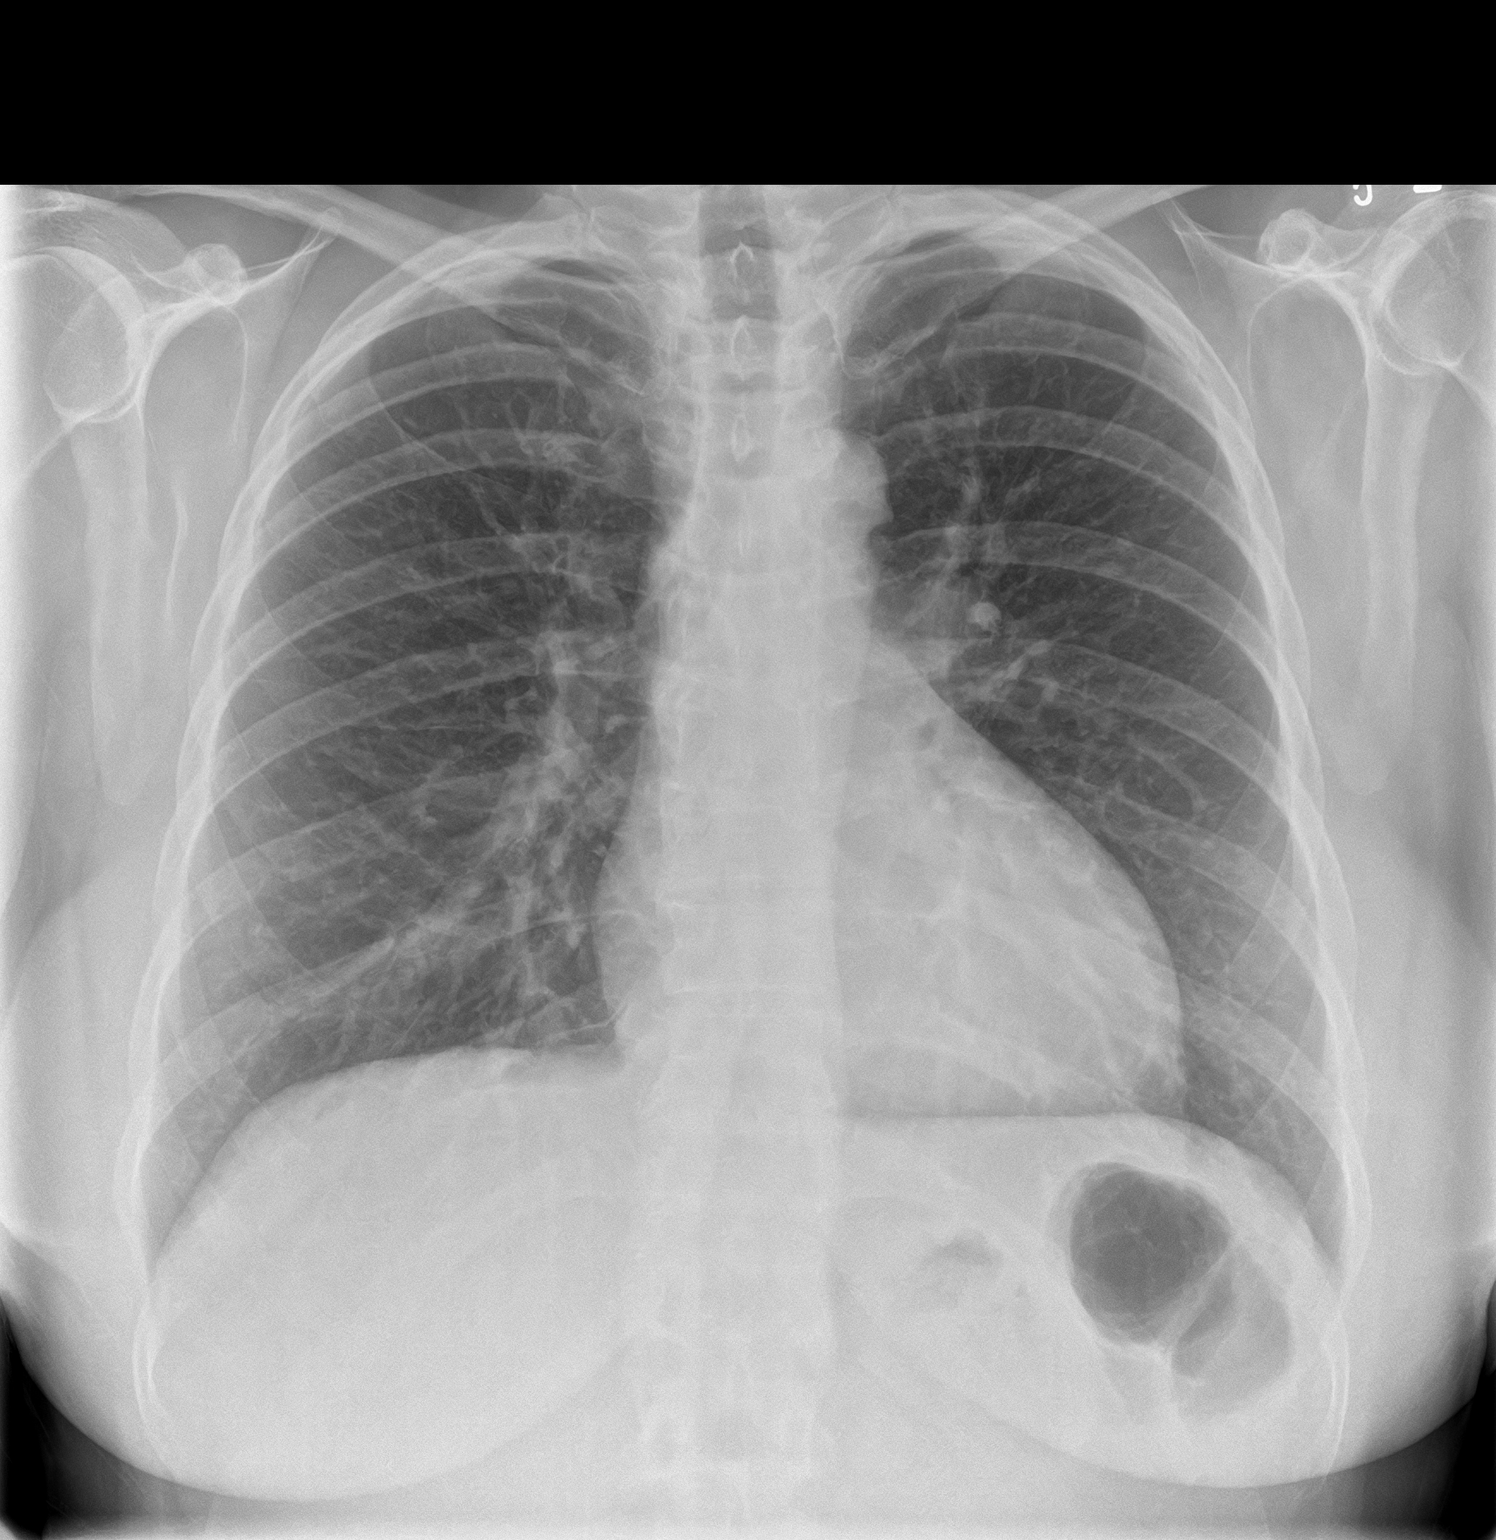
[im 2/2]
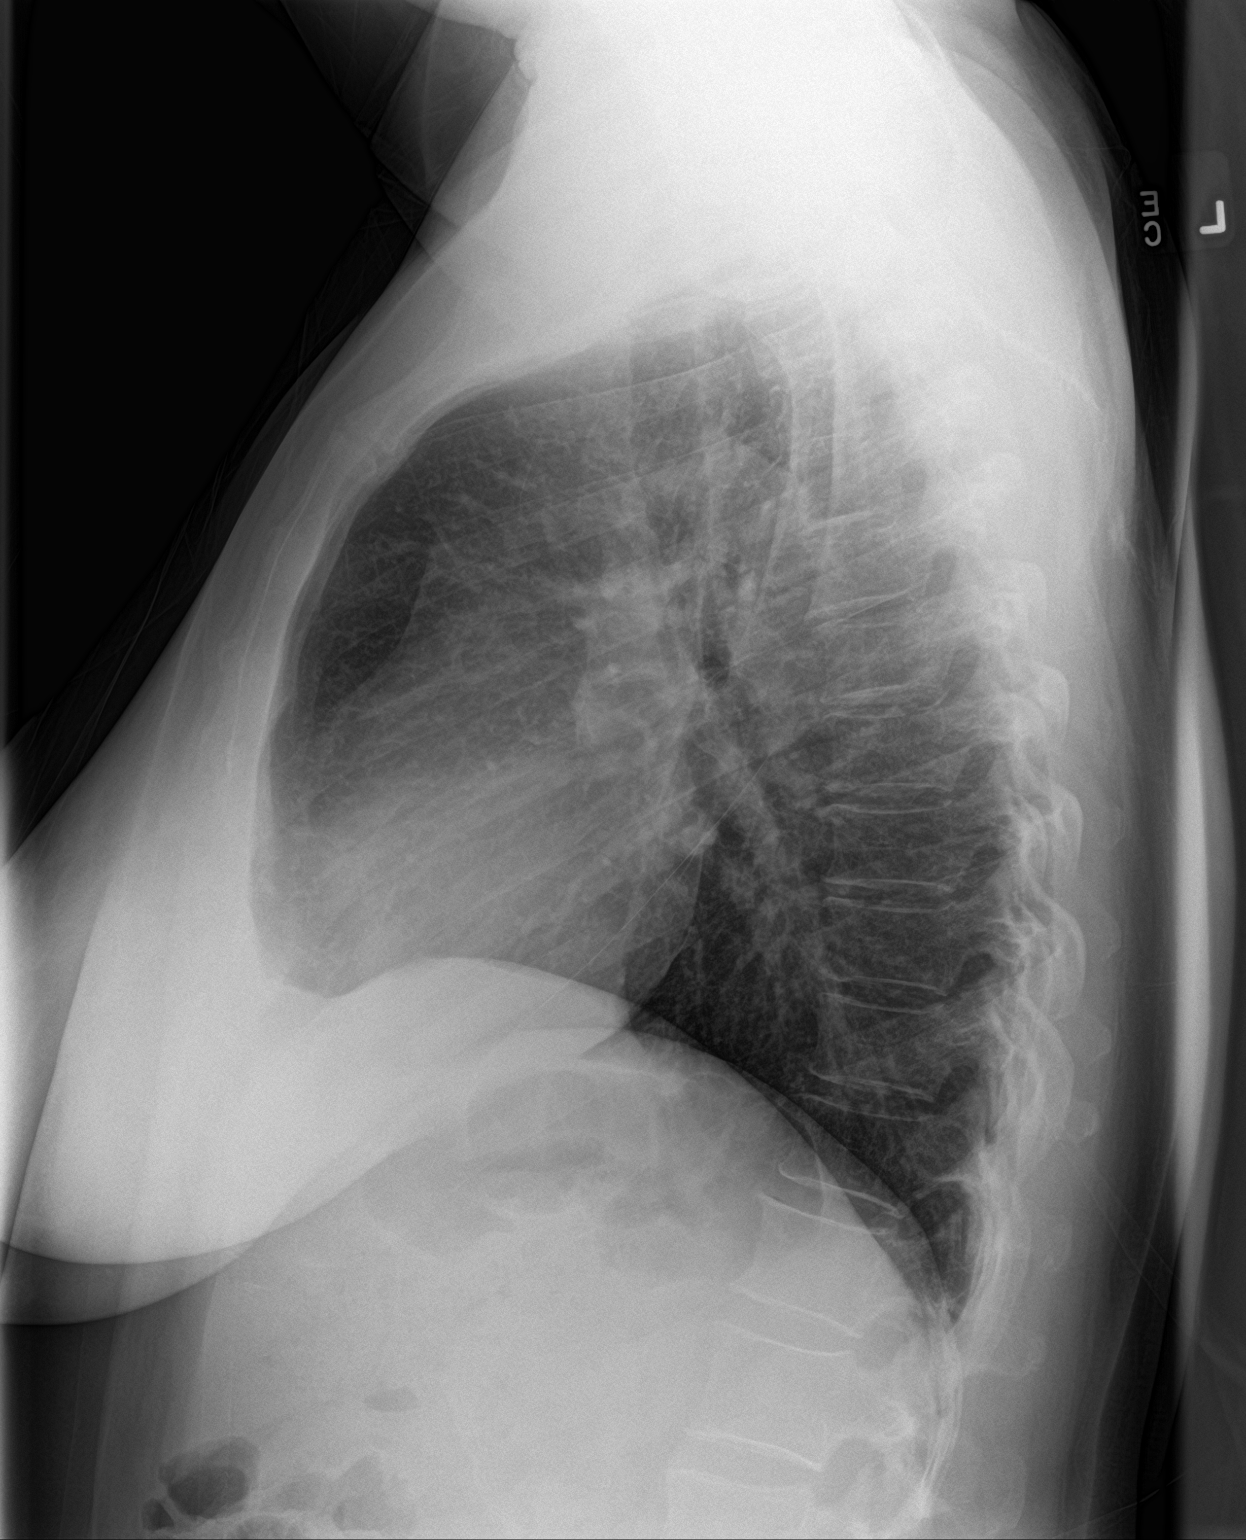

[2 of 2 positions shown; findings below may reference images not displayed]

FINDINGS: The heart size and mediastinal contours are within normal limits. No
mediastinal widening. No aortic aneurysm. Both lungs are clear. The
visualized skeletal structures are unremarkable.
IMPRESSION: Stable appearance of the chest.  No active cardiopulmonary disease.

## 2025-02-04 ENCOUNTER — Ambulatory Visit
